# Patient Record
Sex: Female | Born: 1960 | Race: Asian | Hispanic: No | Marital: Married
Health system: Southern US, Community
[De-identification: ages and names within clinical notes are randomized; demographics above are authoritative.]

---

## 1999-08-14 ENCOUNTER — Encounter: Admission: RE | Admit: 1999-08-14 | Discharge: 1999-11-12 | Payer: Self-pay | Admitting: Family Medicine

## 2001-05-11 ENCOUNTER — Encounter: Admission: RE | Admit: 2001-05-11 | Discharge: 2001-05-11 | Payer: Self-pay | Admitting: Family Medicine

## 2002-01-12 ENCOUNTER — Encounter: Admission: RE | Admit: 2002-01-12 | Discharge: 2002-01-12 | Payer: Self-pay | Admitting: Family Medicine

## 2002-01-12 ENCOUNTER — Encounter: Payer: Self-pay | Admitting: Family Medicine

## 2003-09-04 ENCOUNTER — Encounter: Payer: Self-pay | Admitting: Family Medicine

## 2003-09-04 ENCOUNTER — Encounter: Admission: RE | Admit: 2003-09-04 | Discharge: 2003-09-04 | Payer: Self-pay | Admitting: Family Medicine

## 2005-03-09 ENCOUNTER — Encounter: Admission: RE | Admit: 2005-03-09 | Discharge: 2005-03-09 | Payer: Self-pay | Admitting: Family Medicine

## 2006-07-08 ENCOUNTER — Encounter: Admission: RE | Admit: 2006-07-08 | Discharge: 2006-07-08 | Payer: Self-pay | Admitting: Family Medicine

## 2006-08-15 ENCOUNTER — Encounter: Admission: RE | Admit: 2006-08-15 | Discharge: 2006-08-15 | Payer: Self-pay | Admitting: Family Medicine

## 2007-08-02 ENCOUNTER — Encounter: Admission: RE | Admit: 2007-08-02 | Discharge: 2007-08-02 | Payer: Self-pay | Admitting: Family Medicine

## 2008-08-13 ENCOUNTER — Encounter: Admission: RE | Admit: 2008-08-13 | Discharge: 2008-08-13 | Payer: Self-pay | Admitting: Family Medicine

## 2009-02-06 ENCOUNTER — Encounter: Admission: RE | Admit: 2009-02-06 | Discharge: 2009-02-06 | Payer: Self-pay | Admitting: Orthopedic Surgery

## 2010-02-20 ENCOUNTER — Encounter: Admission: RE | Admit: 2010-02-20 | Discharge: 2010-02-20 | Payer: Self-pay | Admitting: Family Medicine

## 2011-01-23 ENCOUNTER — Other Ambulatory Visit: Payer: Self-pay | Admitting: Family Medicine

## 2011-01-23 DIAGNOSIS — Z1231 Encounter for screening mammogram for malignant neoplasm of breast: Secondary | ICD-10-CM

## 2011-02-24 ENCOUNTER — Ambulatory Visit
Admission: RE | Admit: 2011-02-24 | Discharge: 2011-02-24 | Disposition: A | Discharge status: Home / Self Care | Payer: BC Managed Care – PPO | Source: Ambulatory Visit | Attending: Family Medicine | Admitting: Family Medicine

## 2011-02-24 DIAGNOSIS — Z1231 Encounter for screening mammogram for malignant neoplasm of breast: Secondary | ICD-10-CM

## 2012-03-30 ENCOUNTER — Other Ambulatory Visit: Payer: Self-pay | Admitting: Family Medicine

## 2012-03-30 DIAGNOSIS — Z1231 Encounter for screening mammogram for malignant neoplasm of breast: Secondary | ICD-10-CM

## 2012-04-18 ENCOUNTER — Ambulatory Visit
Admission: RE | Admit: 2012-04-18 | Discharge: 2012-04-18 | Disposition: A | Discharge status: Home / Self Care | Payer: BC Managed Care – PPO | Source: Ambulatory Visit | Attending: Family Medicine | Admitting: Family Medicine

## 2012-04-18 DIAGNOSIS — Z1231 Encounter for screening mammogram for malignant neoplasm of breast: Secondary | ICD-10-CM

## 2014-02-21 ENCOUNTER — Other Ambulatory Visit: Payer: Self-pay | Admitting: Family Medicine

## 2014-02-21 DIAGNOSIS — Z1231 Encounter for screening mammogram for malignant neoplasm of breast: Secondary | ICD-10-CM

## 2014-02-28 ENCOUNTER — Ambulatory Visit
Admission: RE | Admit: 2014-02-28 | Discharge: 2014-02-28 | Disposition: A | Discharge status: Home / Self Care | Payer: BC Managed Care – PPO | Source: Ambulatory Visit | Attending: Family Medicine | Admitting: Family Medicine

## 2014-02-28 DIAGNOSIS — Z1231 Encounter for screening mammogram for malignant neoplasm of breast: Secondary | ICD-10-CM

## 2016-09-22 DIAGNOSIS — Z124 Encounter for screening for malignant neoplasm of cervix: Secondary | ICD-10-CM | POA: Diagnosis not present

## 2016-09-22 DIAGNOSIS — N920 Excessive and frequent menstruation with regular cycle: Secondary | ICD-10-CM | POA: Diagnosis not present

## 2016-12-16 DIAGNOSIS — Z13220 Encounter for screening for lipoid disorders: Secondary | ICD-10-CM | POA: Diagnosis not present

## 2016-12-16 DIAGNOSIS — Z130 Encounter for screening for diseases of the blood and blood-forming organs and certain disorders involving the immune mechanism: Secondary | ICD-10-CM | POA: Diagnosis not present

## 2016-12-16 DIAGNOSIS — Z1231 Encounter for screening mammogram for malignant neoplasm of breast: Secondary | ICD-10-CM | POA: Diagnosis not present

## 2016-12-16 DIAGNOSIS — Z0000 Encounter for general adult medical examination without abnormal findings: Secondary | ICD-10-CM | POA: Diagnosis not present

## 2017-01-19 DIAGNOSIS — G8929 Other chronic pain: Secondary | ICD-10-CM | POA: Diagnosis not present

## 2017-01-19 DIAGNOSIS — M25562 Pain in left knee: Secondary | ICD-10-CM | POA: Diagnosis not present

## 2017-01-19 DIAGNOSIS — Z23 Encounter for immunization: Secondary | ICD-10-CM | POA: Diagnosis not present

## 2017-01-19 DIAGNOSIS — Z7189 Other specified counseling: Secondary | ICD-10-CM | POA: Diagnosis not present

## 2017-01-19 DIAGNOSIS — M25512 Pain in left shoulder: Secondary | ICD-10-CM | POA: Diagnosis not present

## 2017-01-29 ENCOUNTER — Ambulatory Visit
Admission: RE | Admit: 2017-01-29 | Discharge: 2017-01-29 | Disposition: A | Discharge status: Home / Self Care | Payer: BLUE CROSS/BLUE SHIELD | Source: Ambulatory Visit | Attending: Family Medicine | Admitting: Family Medicine

## 2017-01-29 ENCOUNTER — Other Ambulatory Visit: Payer: Self-pay | Admitting: Family Medicine

## 2017-01-29 DIAGNOSIS — M25562 Pain in left knee: Secondary | ICD-10-CM

## 2017-01-29 DIAGNOSIS — M25512 Pain in left shoulder: Secondary | ICD-10-CM

## 2017-06-28 DIAGNOSIS — M546 Pain in thoracic spine: Secondary | ICD-10-CM | POA: Diagnosis not present

## 2017-06-28 DIAGNOSIS — M545 Low back pain: Secondary | ICD-10-CM | POA: Diagnosis not present

## 2017-06-28 DIAGNOSIS — M7552 Bursitis of left shoulder: Secondary | ICD-10-CM | POA: Diagnosis not present

## 2017-09-24 DIAGNOSIS — M546 Pain in thoracic spine: Secondary | ICD-10-CM | POA: Diagnosis not present

## 2017-09-24 DIAGNOSIS — M7552 Bursitis of left shoulder: Secondary | ICD-10-CM | POA: Diagnosis not present

## 2017-09-24 DIAGNOSIS — M542 Cervicalgia: Secondary | ICD-10-CM | POA: Diagnosis not present

## 2017-09-29 DIAGNOSIS — M542 Cervicalgia: Secondary | ICD-10-CM | POA: Diagnosis not present

## 2017-09-29 DIAGNOSIS — M546 Pain in thoracic spine: Secondary | ICD-10-CM | POA: Diagnosis not present

## 2017-09-29 DIAGNOSIS — M7552 Bursitis of left shoulder: Secondary | ICD-10-CM | POA: Diagnosis not present

## 2017-12-07 DIAGNOSIS — M545 Low back pain: Secondary | ICD-10-CM | POA: Diagnosis not present

## 2017-12-07 DIAGNOSIS — M79605 Pain in left leg: Secondary | ICD-10-CM | POA: Diagnosis not present

## 2017-12-07 DIAGNOSIS — M79604 Pain in right leg: Secondary | ICD-10-CM | POA: Diagnosis not present

## 2017-12-15 DIAGNOSIS — M545 Low back pain: Secondary | ICD-10-CM | POA: Diagnosis not present

## 2017-12-15 DIAGNOSIS — M79605 Pain in left leg: Secondary | ICD-10-CM | POA: Diagnosis not present

## 2017-12-15 DIAGNOSIS — M79604 Pain in right leg: Secondary | ICD-10-CM | POA: Diagnosis not present

## 2017-12-28 DIAGNOSIS — M79605 Pain in left leg: Secondary | ICD-10-CM | POA: Diagnosis not present

## 2017-12-28 DIAGNOSIS — M79604 Pain in right leg: Secondary | ICD-10-CM | POA: Diagnosis not present

## 2017-12-28 DIAGNOSIS — M545 Low back pain: Secondary | ICD-10-CM | POA: Diagnosis not present

## 2018-01-12 DIAGNOSIS — M545 Low back pain: Secondary | ICD-10-CM | POA: Diagnosis not present

## 2018-01-12 DIAGNOSIS — M79605 Pain in left leg: Secondary | ICD-10-CM | POA: Diagnosis not present

## 2018-01-12 DIAGNOSIS — M79604 Pain in right leg: Secondary | ICD-10-CM | POA: Diagnosis not present

## 2018-01-26 DIAGNOSIS — M545 Low back pain: Secondary | ICD-10-CM | POA: Diagnosis not present

## 2018-01-26 DIAGNOSIS — M79605 Pain in left leg: Secondary | ICD-10-CM | POA: Diagnosis not present

## 2018-01-26 DIAGNOSIS — M79604 Pain in right leg: Secondary | ICD-10-CM | POA: Diagnosis not present

## 2018-04-12 DIAGNOSIS — M9903 Segmental and somatic dysfunction of lumbar region: Secondary | ICD-10-CM | POA: Diagnosis not present

## 2018-04-12 DIAGNOSIS — M545 Low back pain: Secondary | ICD-10-CM | POA: Diagnosis not present

## 2018-04-12 DIAGNOSIS — M2560 Stiffness of unspecified joint, not elsewhere classified: Secondary | ICD-10-CM | POA: Diagnosis not present

## 2018-04-12 DIAGNOSIS — R293 Abnormal posture: Secondary | ICD-10-CM | POA: Diagnosis not present

## 2018-04-20 DIAGNOSIS — L247 Irritant contact dermatitis due to plants, except food: Secondary | ICD-10-CM | POA: Diagnosis not present

## 2018-04-20 DIAGNOSIS — L03116 Cellulitis of left lower limb: Secondary | ICD-10-CM | POA: Diagnosis not present

## 2018-05-03 DIAGNOSIS — Z1239 Encounter for other screening for malignant neoplasm of breast: Secondary | ICD-10-CM | POA: Diagnosis not present

## 2018-05-03 DIAGNOSIS — Z0000 Encounter for general adult medical examination without abnormal findings: Secondary | ICD-10-CM | POA: Diagnosis not present

## 2018-05-03 DIAGNOSIS — Z13220 Encounter for screening for lipoid disorders: Secondary | ICD-10-CM | POA: Diagnosis not present

## 2018-05-04 ENCOUNTER — Other Ambulatory Visit: Payer: Self-pay | Admitting: Family Medicine

## 2018-05-04 DIAGNOSIS — Z1231 Encounter for screening mammogram for malignant neoplasm of breast: Secondary | ICD-10-CM

## 2018-05-18 DIAGNOSIS — Z0000 Encounter for general adult medical examination without abnormal findings: Secondary | ICD-10-CM | POA: Diagnosis not present

## 2018-05-18 DIAGNOSIS — Z136 Encounter for screening for cardiovascular disorders: Secondary | ICD-10-CM | POA: Diagnosis not present

## 2018-05-25 ENCOUNTER — Ambulatory Visit
Admission: RE | Admit: 2018-05-25 | Discharge: 2018-05-25 | Disposition: A | Discharge status: Home / Self Care | Payer: BLUE CROSS/BLUE SHIELD | Source: Ambulatory Visit | Attending: Family Medicine | Admitting: Family Medicine

## 2018-05-25 DIAGNOSIS — Z1231 Encounter for screening mammogram for malignant neoplasm of breast: Secondary | ICD-10-CM

## 2018-12-28 DIAGNOSIS — M6281 Muscle weakness (generalized): Secondary | ICD-10-CM | POA: Diagnosis not present

## 2018-12-28 DIAGNOSIS — M79605 Pain in left leg: Secondary | ICD-10-CM | POA: Diagnosis not present

## 2018-12-28 DIAGNOSIS — R202 Paresthesia of skin: Secondary | ICD-10-CM | POA: Diagnosis not present

## 2018-12-28 DIAGNOSIS — M545 Low back pain: Secondary | ICD-10-CM | POA: Diagnosis not present

## 2019-01-04 DIAGNOSIS — M79605 Pain in left leg: Secondary | ICD-10-CM | POA: Diagnosis not present

## 2019-01-04 DIAGNOSIS — M6281 Muscle weakness (generalized): Secondary | ICD-10-CM | POA: Diagnosis not present

## 2019-01-04 DIAGNOSIS — R202 Paresthesia of skin: Secondary | ICD-10-CM | POA: Diagnosis not present

## 2019-01-04 DIAGNOSIS — M545 Low back pain: Secondary | ICD-10-CM | POA: Diagnosis not present

## 2019-01-18 DIAGNOSIS — M6281 Muscle weakness (generalized): Secondary | ICD-10-CM | POA: Diagnosis not present

## 2019-01-18 DIAGNOSIS — M79605 Pain in left leg: Secondary | ICD-10-CM | POA: Diagnosis not present

## 2019-01-18 DIAGNOSIS — R202 Paresthesia of skin: Secondary | ICD-10-CM | POA: Diagnosis not present

## 2019-01-18 DIAGNOSIS — M545 Low back pain: Secondary | ICD-10-CM | POA: Diagnosis not present

## 2019-05-08 ENCOUNTER — Other Ambulatory Visit: Payer: Self-pay | Admitting: Family Medicine

## 2019-05-08 DIAGNOSIS — Z1231 Encounter for screening mammogram for malignant neoplasm of breast: Secondary | ICD-10-CM

## 2019-05-11 DIAGNOSIS — Z0000 Encounter for general adult medical examination without abnormal findings: Secondary | ICD-10-CM | POA: Diagnosis not present

## 2019-05-22 DIAGNOSIS — S40862A Insect bite (nonvenomous) of left upper arm, initial encounter: Secondary | ICD-10-CM | POA: Diagnosis not present

## 2019-05-22 DIAGNOSIS — W57XXXA Bitten or stung by nonvenomous insect and other nonvenomous arthropods, initial encounter: Secondary | ICD-10-CM | POA: Diagnosis not present

## 2019-06-16 DIAGNOSIS — D124 Benign neoplasm of descending colon: Secondary | ICD-10-CM | POA: Diagnosis not present

## 2019-06-16 DIAGNOSIS — Z1211 Encounter for screening for malignant neoplasm of colon: Secondary | ICD-10-CM | POA: Diagnosis not present

## 2019-06-16 DIAGNOSIS — D125 Benign neoplasm of sigmoid colon: Secondary | ICD-10-CM | POA: Diagnosis not present

## 2019-06-20 ENCOUNTER — Other Ambulatory Visit: Payer: Self-pay

## 2019-06-20 ENCOUNTER — Ambulatory Visit: Payer: BLUE CROSS/BLUE SHIELD

## 2019-06-20 ENCOUNTER — Ambulatory Visit
Admission: RE | Admit: 2019-06-20 | Discharge: 2019-06-20 | Disposition: A | Discharge status: Home / Self Care | Payer: BC Managed Care – PPO | Source: Ambulatory Visit | Attending: Family Medicine | Admitting: Family Medicine

## 2019-06-20 DIAGNOSIS — Z1231 Encounter for screening mammogram for malignant neoplasm of breast: Secondary | ICD-10-CM

## 2019-07-27 DIAGNOSIS — M545 Low back pain: Secondary | ICD-10-CM | POA: Diagnosis not present

## 2019-07-27 DIAGNOSIS — M25562 Pain in left knee: Secondary | ICD-10-CM | POA: Diagnosis not present

## 2019-08-03 DIAGNOSIS — M25562 Pain in left knee: Secondary | ICD-10-CM | POA: Diagnosis not present

## 2019-08-08 DIAGNOSIS — M25562 Pain in left knee: Secondary | ICD-10-CM | POA: Diagnosis not present

## 2019-08-21 DIAGNOSIS — S83242A Other tear of medial meniscus, current injury, left knee, initial encounter: Secondary | ICD-10-CM | POA: Diagnosis not present

## 2019-08-21 DIAGNOSIS — X58XXXA Exposure to other specified factors, initial encounter: Secondary | ICD-10-CM | POA: Diagnosis not present

## 2019-08-21 DIAGNOSIS — S83282A Other tear of lateral meniscus, current injury, left knee, initial encounter: Secondary | ICD-10-CM | POA: Diagnosis not present

## 2019-08-21 DIAGNOSIS — Y999 Unspecified external cause status: Secondary | ICD-10-CM | POA: Diagnosis not present

## 2019-08-21 DIAGNOSIS — S83272A Complex tear of lateral meniscus, current injury, left knee, initial encounter: Secondary | ICD-10-CM | POA: Diagnosis not present

## 2019-08-21 DIAGNOSIS — M948X6 Other specified disorders of cartilage, lower leg: Secondary | ICD-10-CM | POA: Diagnosis not present

## 2019-08-29 DIAGNOSIS — M65311 Trigger thumb, right thumb: Secondary | ICD-10-CM | POA: Diagnosis not present

## 2019-08-29 DIAGNOSIS — S83242D Other tear of medial meniscus, current injury, left knee, subsequent encounter: Secondary | ICD-10-CM | POA: Diagnosis not present

## 2019-09-23 DIAGNOSIS — Z20828 Contact with and (suspected) exposure to other viral communicable diseases: Secondary | ICD-10-CM | POA: Diagnosis not present

## 2019-09-27 DIAGNOSIS — Z20828 Contact with and (suspected) exposure to other viral communicable diseases: Secondary | ICD-10-CM | POA: Diagnosis not present

## 2019-10-03 DIAGNOSIS — M65311 Trigger thumb, right thumb: Secondary | ICD-10-CM | POA: Diagnosis not present

## 2019-10-04 DIAGNOSIS — M25522 Pain in left elbow: Secondary | ICD-10-CM | POA: Diagnosis not present

## 2019-10-04 DIAGNOSIS — M25562 Pain in left knee: Secondary | ICD-10-CM | POA: Diagnosis not present

## 2019-10-10 DIAGNOSIS — M25522 Pain in left elbow: Secondary | ICD-10-CM | POA: Diagnosis not present

## 2019-10-10 DIAGNOSIS — M25562 Pain in left knee: Secondary | ICD-10-CM | POA: Diagnosis not present

## 2019-10-13 IMAGING — MG DIGITAL SCREENING BILATERAL MAMMOGRAM WITH CAD
4 series · 4 of 4 positions shown · non-contrast
Comparison: Previous exam(s).

CLINICAL DATA: Screening.

EXAM:
DIGITAL SCREENING BILATERAL MAMMOGRAM WITH CAD

[R MLO]
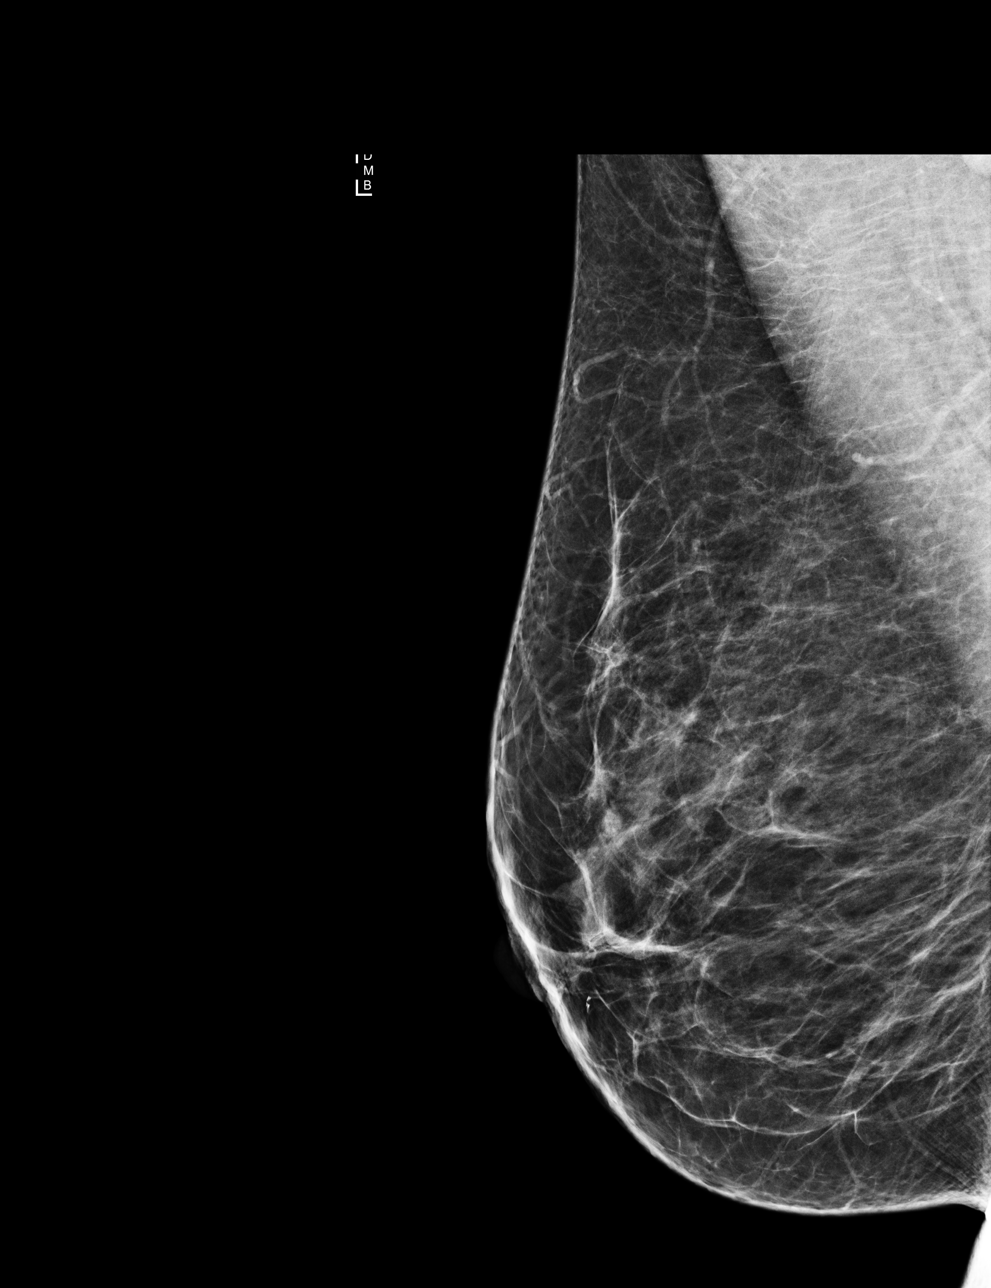

[L CC]
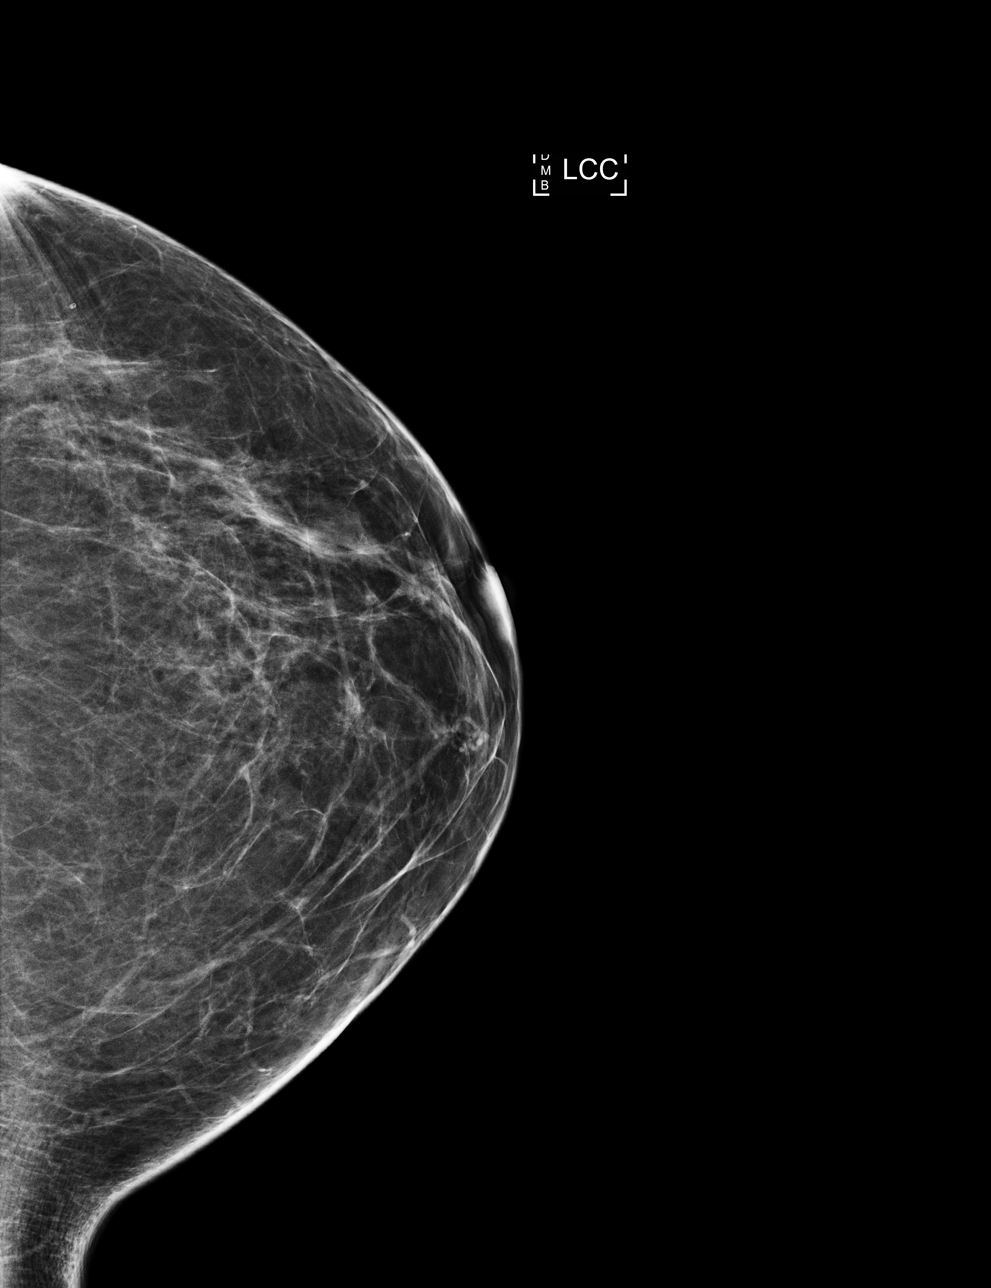

[L MLO]
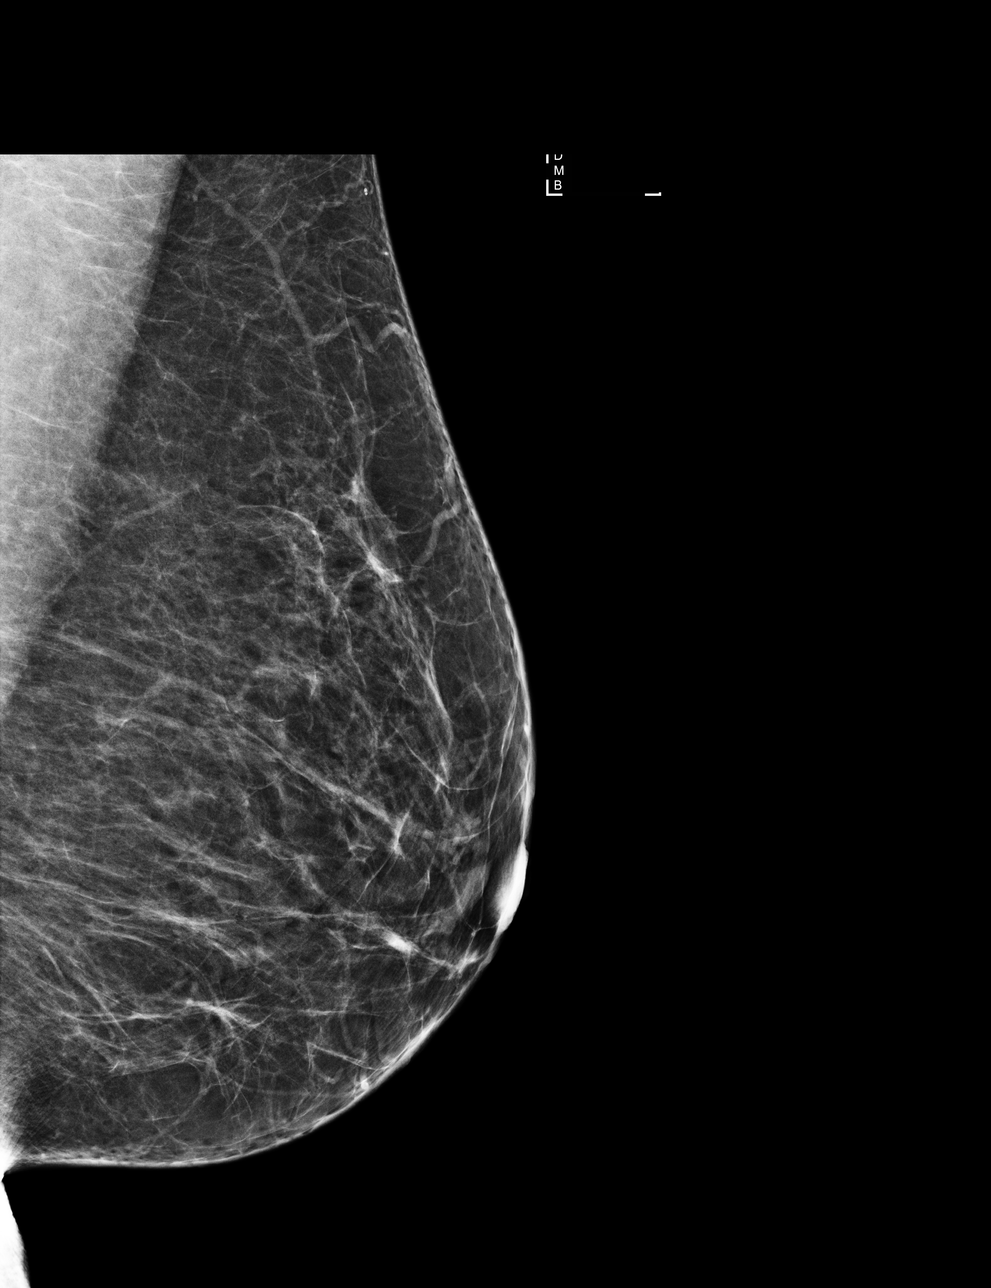

[R CC]
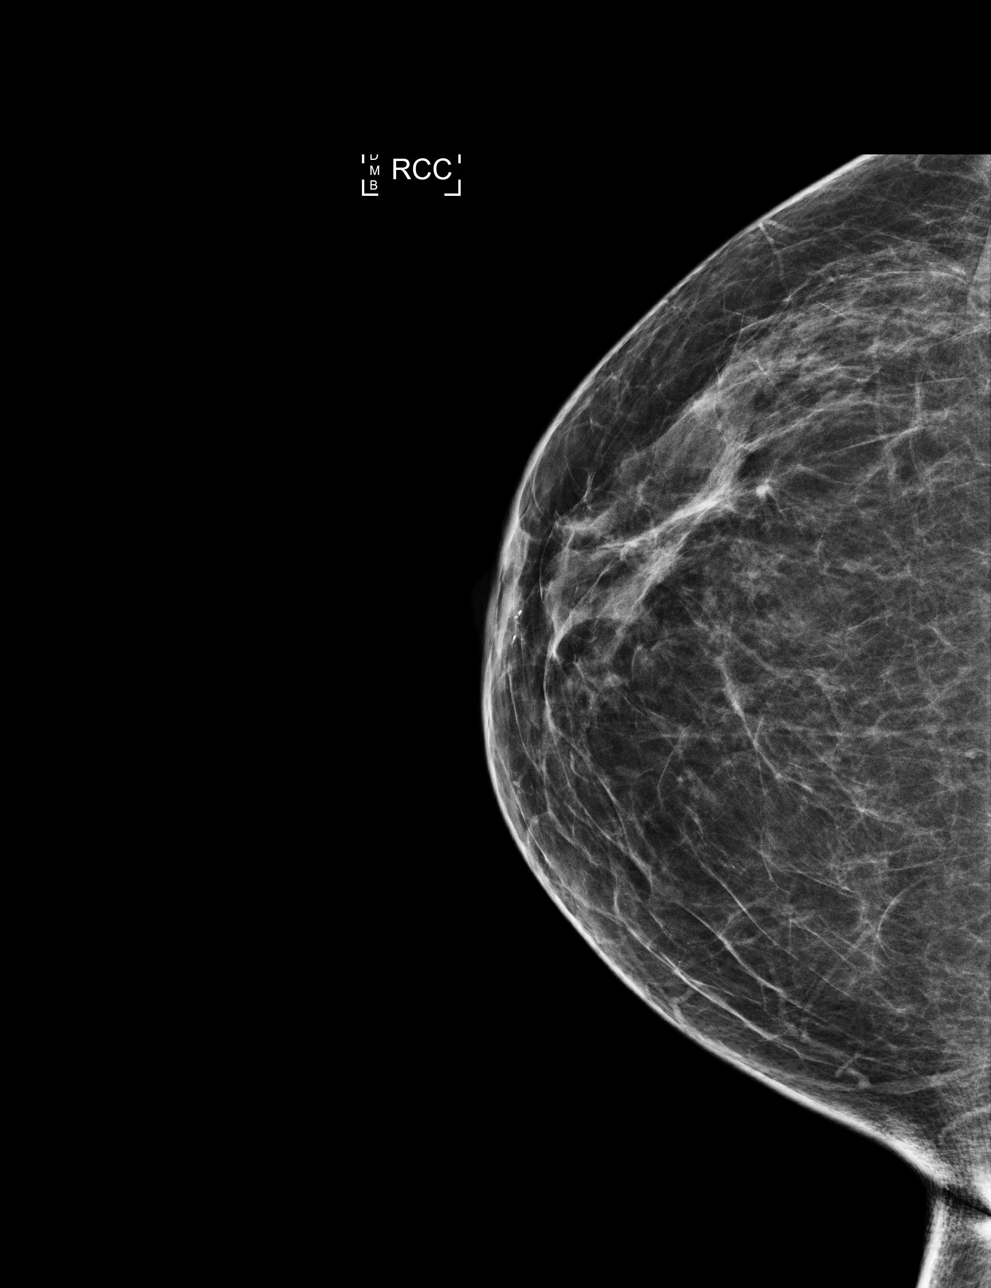

[4 of 4 positions shown; findings below may reference images not displayed]

ACR Breast Density Category b: There are scattered areas of
fibroglandular density.
FINDINGS: There are no findings suspicious for malignancy. Images were
processed with CAD.
IMPRESSION: No mammographic evidence of malignancy. A result letter of this
screening mammogram will be mailed directly to the patient.

RECOMMENDATION:
Screening mammogram in one year. (Code:AS-G-LCT)

BI-RADS CATEGORY  1: Negative.

## 2019-10-17 DIAGNOSIS — M25562 Pain in left knee: Secondary | ICD-10-CM | POA: Diagnosis not present

## 2019-10-17 DIAGNOSIS — M25522 Pain in left elbow: Secondary | ICD-10-CM | POA: Diagnosis not present

## 2019-10-26 DIAGNOSIS — M25562 Pain in left knee: Secondary | ICD-10-CM | POA: Diagnosis not present

## 2020-01-14 DIAGNOSIS — Z03818 Encounter for observation for suspected exposure to other biological agents ruled out: Secondary | ICD-10-CM | POA: Diagnosis not present

## 2020-01-14 DIAGNOSIS — Z20828 Contact with and (suspected) exposure to other viral communicable diseases: Secondary | ICD-10-CM | POA: Diagnosis not present

## 2020-02-06 DIAGNOSIS — M9904 Segmental and somatic dysfunction of sacral region: Secondary | ICD-10-CM | POA: Diagnosis not present

## 2020-02-06 DIAGNOSIS — M5137 Other intervertebral disc degeneration, lumbosacral region: Secondary | ICD-10-CM | POA: Diagnosis not present

## 2020-02-06 DIAGNOSIS — M9903 Segmental and somatic dysfunction of lumbar region: Secondary | ICD-10-CM | POA: Diagnosis not present

## 2020-02-06 DIAGNOSIS — M4316 Spondylolisthesis, lumbar region: Secondary | ICD-10-CM | POA: Diagnosis not present

## 2020-02-09 ENCOUNTER — Ambulatory Visit: Payer: BC Managed Care – PPO | Attending: Internal Medicine

## 2020-02-09 DIAGNOSIS — Z20822 Contact with and (suspected) exposure to covid-19: Secondary | ICD-10-CM | POA: Diagnosis not present

## 2020-02-10 LAB — SARS-COV-2, NAA 2 DAY TAT

## 2020-02-10 LAB — NOVEL CORONAVIRUS, NAA: SARS-CoV-2, NAA: NOT DETECTED

## 2020-03-30 DIAGNOSIS — Z03818 Encounter for observation for suspected exposure to other biological agents ruled out: Secondary | ICD-10-CM | POA: Diagnosis not present

## 2020-03-30 DIAGNOSIS — Z20828 Contact with and (suspected) exposure to other viral communicable diseases: Secondary | ICD-10-CM | POA: Diagnosis not present

## 2020-05-01 DIAGNOSIS — Z131 Encounter for screening for diabetes mellitus: Secondary | ICD-10-CM | POA: Diagnosis not present

## 2020-05-01 DIAGNOSIS — Z13220 Encounter for screening for lipoid disorders: Secondary | ICD-10-CM | POA: Diagnosis not present

## 2020-05-01 DIAGNOSIS — Z0000 Encounter for general adult medical examination without abnormal findings: Secondary | ICD-10-CM | POA: Diagnosis not present

## 2021-05-12 ENCOUNTER — Other Ambulatory Visit: Payer: Self-pay | Admitting: Family Medicine

## 2021-05-12 DIAGNOSIS — Z1231 Encounter for screening mammogram for malignant neoplasm of breast: Secondary | ICD-10-CM

## 2021-07-09 ENCOUNTER — Ambulatory Visit: Payer: Self-pay

## 2021-07-17 ENCOUNTER — Other Ambulatory Visit: Payer: Self-pay

## 2021-07-17 ENCOUNTER — Ambulatory Visit (INDEPENDENT_AMBULATORY_CARE_PROVIDER_SITE_OTHER): Payer: 59

## 2021-07-17 DIAGNOSIS — Z1231 Encounter for screening mammogram for malignant neoplasm of breast: Secondary | ICD-10-CM | POA: Diagnosis not present

## 2021-10-30 ENCOUNTER — Other Ambulatory Visit: Payer: Self-pay

## 2021-10-30 ENCOUNTER — Ambulatory Visit (INDEPENDENT_AMBULATORY_CARE_PROVIDER_SITE_OTHER): Payer: 59

## 2021-10-30 ENCOUNTER — Ambulatory Visit (INDEPENDENT_AMBULATORY_CARE_PROVIDER_SITE_OTHER): Payer: 59 | Admitting: Orthopedic Surgery

## 2021-10-30 DIAGNOSIS — M25532 Pain in left wrist: Secondary | ICD-10-CM | POA: Diagnosis not present

## 2021-10-30 DIAGNOSIS — M25832 Other specified joint disorders, left wrist: Secondary | ICD-10-CM | POA: Diagnosis not present

## 2021-10-30 NOTE — Progress Notes (Signed)
Office Visit Note   Patient: Tamara Reeves           Date of Birth: 20-Mar-1961           MRN: [id number] Visit Date: 10/30/2021              Requested by: Koren Shiver, DO [id number] [place],  [place] [address] PCP: Koren Shiver, DO   Assessment & Plan: Visit Diagnoses:  1. Pain in left wrist   2. Ulnocarpal abutment syndrome, left     Plan: Patient's symptoms seem consistent with ulnocarpal abutment syndrome.  Her pain is directly distal to the ulnar head at the ulnocarpal articulation. We reviewed her x-rays which suggest ulnar positive variance on the PA view with cystic changes at the proximal and ulnar lunate.  We discussed getting an MRI to better characterize the lesion and evaluate the remainder of the ulnar sided structures.  We discussed conservative and surgical treatment options.  Because of insurance issues, she'd like to try a different wrist brace and an oral anti-inflammatory.  She will follow up again in January when her insurance status is more stable.   Follow-Up Instructions: No follow-ups on file.   Orders:  Orders Placed This Encounter  Procedures   XR Wrist Complete Left   No orders of the defined types were placed in this encounter.     Procedures: No procedures performed   Clinical Data: No additional findings.   Subjective: Chief Complaint  Patient presents with   Left Wrist - Pain    This is a 60 yo RHD F who presents with left sided wrist pain.  Her issues seem to have started after a GLF in late November while hiking [place]  Her pain is dorsal and ulnar.  It is at the ulnocarpal joint.  She has no pain w/ pronation or supination.  She has pain w/ hyper-extension activities like push ups.  She does note that she had some wrist discomfort prior to the fall but not as severe. She has tried a soft compression brace which helps some.    Review of Systems   Objective: Vital Signs: There were no vitals taken  for this visit.  Physical Exam Constitutional:      Appearance: Normal appearance.  Cardiovascular:     Rate and Rhythm: Normal rate.     Pulses: Normal pulses.  Pulmonary:     Effort: Pulmonary effort is normal.  Skin:    General: Skin is warm and dry.     Capillary Refill: Capillary refill takes less than 2 seconds.  Neurological:     Mental Status: She is alert.    Left Hand Exam   Tenderness  Left hand tenderness location: TTP just distal to ulnar head at dorsal aspect of ulnocarpal joint.   Range of Motion  The patient has normal left wrist ROM.  Muscle Strength  The patient has normal left wrist strength.  Other  Erythema: absent Sensation: normal Pulse: present  Comments:  Dorsal and ulnar pain w/ hyper-extension.  TTP just distal to ulnar head.  No ECU instability.  Mild pain w/ ECU synergy test.  No pain w/ DRUJ compression and no DRUJ instability.      Specialty Comments:  No specialty comments available.  Imaging: 3V of the left wrist taken today are reviewed and interpreted by me.  They demonstrate positive ulnar variance of 1.5-2 mm on these projections.  There appear to  be cystic changes at the proximal and ulnar aspect of the lunate.    PMFS History: Patient Active Problem List   Diagnosis Date Noted   Ulnocarpal abutment syndrome, left 10/30/2021   No past medical history on file.  No family history on file.  No past surgical history on file. Social History   Occupational History   Not on file  Tobacco Use   Smoking status: Not on file   Smokeless tobacco: Not on file  Substance and Sexual Activity   Alcohol use: Not on file   Drug use: Not on file   Sexual activity: Not on file

## 2022-11-24 ENCOUNTER — Other Ambulatory Visit: Payer: Self-pay | Admitting: Family Medicine

## 2022-11-24 DIAGNOSIS — Z1231 Encounter for screening mammogram for malignant neoplasm of breast: Secondary | ICD-10-CM

## 2022-12-09 ENCOUNTER — Ambulatory Visit (INDEPENDENT_AMBULATORY_CARE_PROVIDER_SITE_OTHER): Payer: Commercial Managed Care - HMO

## 2022-12-09 DIAGNOSIS — Z1231 Encounter for screening mammogram for malignant neoplasm of breast: Secondary | ICD-10-CM | POA: Diagnosis not present

## 2024-01-25 ENCOUNTER — Other Ambulatory Visit: Payer: Self-pay | Admitting: Family Medicine

## 2024-01-25 DIAGNOSIS — Z1231 Encounter for screening mammogram for malignant neoplasm of breast: Secondary | ICD-10-CM

## 2024-02-03 ENCOUNTER — Ambulatory Visit

## 2024-02-17 ENCOUNTER — Ambulatory Visit
